# Patient Record
Sex: Male | Born: 1990 | Race: White | Hispanic: No | Marital: Single | Smoking: Never smoker
Health system: Southern US, Community
[De-identification: ages and names within clinical notes are randomized; demographics above are authoritative.]

---
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
## 2015-12-29 ENCOUNTER — Emergency Department
Admission: EM | Admit: 2015-12-29 | Discharge: 2015-12-29 | Disposition: A | Discharge status: Home / Self Care | Payer: BLUE CROSS/BLUE SHIELD | Attending: Emergency Medicine | Admitting: Emergency Medicine

## 2015-12-29 ENCOUNTER — Encounter: Payer: Self-pay | Admitting: *Deleted

## 2015-12-29 ENCOUNTER — Emergency Department: Payer: BLUE CROSS/BLUE SHIELD

## 2015-12-29 DIAGNOSIS — R55 Syncope and collapse: Secondary | ICD-10-CM | POA: Diagnosis not present

## 2015-12-29 DIAGNOSIS — R42 Dizziness and giddiness: Secondary | ICD-10-CM | POA: Diagnosis present

## 2015-12-29 LAB — COMPREHENSIVE METABOLIC PANEL
ALT: 13 U/L — ABNORMAL LOW (ref 17–63)
AST: 17 U/L (ref 15–41)
Albumin: 4.9 g/dL (ref 3.5–5.0)
Alkaline Phosphatase: 64 U/L (ref 38–126)
Anion gap: 10 (ref 5–15)
BUN: 13 mg/dL (ref 6–20)
CALCIUM: 9.8 mg/dL (ref 8.9–10.3)
CO2: 27 mmol/L (ref 22–32)
CREATININE: 0.86 mg/dL (ref 0.61–1.24)
Chloride: 102 mmol/L (ref 101–111)
Glucose, Bld: 100 mg/dL — ABNORMAL HIGH (ref 65–99)
Potassium: 4.6 mmol/L (ref 3.5–5.1)
SODIUM: 139 mmol/L (ref 135–145)
Total Bilirubin: 0.9 mg/dL (ref 0.3–1.2)
Total Protein: 7.5 g/dL (ref 6.5–8.1)

## 2015-12-29 LAB — CBC
HCT: 42.9 % (ref 40.0–52.0)
Hemoglobin: 14.8 g/dL (ref 13.0–18.0)
MCH: 32.3 pg (ref 26.0–34.0)
MCHC: 34.6 g/dL (ref 32.0–36.0)
MCV: 93.3 fL (ref 80.0–100.0)
PLATELETS: 234 10*3/uL (ref 150–440)
RBC: 4.6 MIL/uL (ref 4.40–5.90)
RDW: 12.1 % (ref 11.5–14.5)
WBC: 6 10*3/uL (ref 3.8–10.6)

## 2015-12-29 LAB — TROPONIN I

## 2015-12-29 MED ORDER — SODIUM CHLORIDE 0.9 % IV BOLUS (SEPSIS)
1000.0000 mL | Freq: Once | INTRAVENOUS | Status: AC
  Administered 2015-12-29: 1000 mL via INTRAVENOUS

## 2015-12-29 NOTE — ED Notes (Addendum)
Pt was at work today and had a dizzy spell.  Pt was seen at [facility] in [place] and pt was sent to er for eval of abnormal ekg.  Pt denies any pain.  No sob  No dizziness now.  Pt alert.  Speech clear. No n/v/d.

## 2015-12-29 NOTE — ED Provider Notes (Signed)
[facility] Emergency Department Provider Note  ____________________________________________  Time seen: Approximately 7:53 PM  I have reviewed the triage vital signs and the nursing notes.   HISTORY  Chief Complaint Dizziness    HPI Edward Duncan is a 25 y.o. male , otherwise healthy, presenting with lightheadedness. The patient reports that he was sitting at his desk when he developed a lightheaded sensation "I have never passed out but it felt like I might pass out." He had had a small breakfast and a light lunch, so he ate a protein bar and drank some water and slowly his symptoms resolved. He went to a local urgent care where they were concerned about his EKG so he was sent to the emergency department for further evaluation.  FH: No family history of connective tissue disorder, sudden cardiac death, sudden unknown or Death, or blood clots.  SH: Denies tobacco, cocaine or heroin abuse. Drinks one beer daily.   No past medical history on file.  There are no active problems to display for this patient.   No past surgical history on file.  No current outpatient prescriptions on file.  Allergies Review of patient's allergies indicates no known allergies.  No family history on file.  Social History Social History  Substance Use Topics  . Smoking status: Never Smoker   . Smokeless tobacco: Not on file  . Alcohol Use: No    Review of Systems Constitutional: No fever/chills.Positive lightheadedness. Negative syncope. Eyes: No visual changes. No blurred or double vision. ENT: No sore throat. No congestion or rhinorrhea. Cardiovascular: Denies chest pain. Denies palpitations. Respiratory: Denies shortness of breath.  No cough. Gastrointestinal: No abdominal pain.  No nausea, no vomiting.  No diarrhea.  No constipation. Genitourinary: Negative for dysuria. Musculoskeletal: Negative for back pain. Skin: Negative for rash. Neurological: Negative  for headaches. No focal numbness, tingling or weakness.   10-point ROS otherwise negative.  ____________________________________________   PHYSICAL EXAM:  VITAL SIGNS: ED Triage Vitals  Enc Vitals Group     BP 12/29/15 1746 141/83 mmHg     Pulse Rate 12/29/15 1746 68     Resp 12/29/15 1746 18     Temp 12/29/15 1746 98.5 F (36.9 C)     Temp Source 12/29/15 1746 Oral     SpO2 12/29/15 1746 98 %     Weight 12/29/15 1746 139 lb (63.05 kg)     Height 12/29/15 1746  (1.803 m)     Head Cir --      Peak Flow --      Pain Score --      Pain Loc --      Pain Edu? --      Excl. in GC? --     Constitutional: Alert and oriented. Well appearing and in no acute distress. Answers questions appropriately. Eyes: Conjunctivae are normal.  EOMI. No scleral icterus. Head: Atraumatic. Nose: No congestion/rhinnorhea. Mouth/Throat: Mucous membranes are moist.  Neck: No stridor.  Supple.  No JVD. No meningismus. Cardiovascular: Normal rate, regular rhythm. No murmurs, rubs or gallops.  Respiratory: Normal respiratory effort.  No accessory muscle use or retractions. Lungs CTAB.  No wheezes, rales or ronchi. Gastrointestinal: Soft, nontender and nondistended.  No guarding or rebound.  No peritoneal signs. Musculoskeletal: No LE edema. No ttp in the calves or palpable cords.  Negative Homan's sign. Neurologic:  A&Ox3.  Speech is clear.  Face and smile are symmetric.  EOMI.  Moves all extremities well. Normal gait without  ataxia. Skin:  Skin is warm, dry and intact. No rash noted. Psychiatric: Mood and affect are normal. Speech and behavior are normal.  Normal judgement.  ____________________________________________   LABS (all labs ordered are listed, but only abnormal results are displayed)  Labs Reviewed  COMPREHENSIVE METABOLIC PANEL - Abnormal; Notable for the following:    Glucose, Bld 100 (*)    ALT 13 (*)    All other components within normal limits  CBC  TROPONIN I    ____________________________________________  EKG  ED ECG REPORT I, Rockne MenghiniNorman, Anne-Caroline, the attending physician, personally viewed and interpreted this ECG.   Date: 12/29/2015  EKG Time: 1754  Rate: 69  Rhythm: normal sinus rhythm  Axis: normal  Intervals:none  ST&T Change: Nonspecific T-wave inversion in V1. No ST elevation. No evidence of Brugada syndrome or prolonged QTC. Patient does have some increased voltage criteria, but this is more likely due to being thin male then true hypertrophy.  This EKG is similar to an EKG obtained at urgent care.  ____________________________________________  RADIOLOGY  Dg Chest 2 View  12/29/2015  CLINICAL DATA:  Acute onset of dizziness and near syncope. Initial encounter. EXAM: CHEST  2 VIEW COMPARISON:  None. FINDINGS: The lungs are well-aerated. Mild peribronchial thickening is noted. There is no evidence of focal opacification, pleural effusion or pneumothorax. The heart is normal in size; the mediastinal contour is within normal limits. No acute osseous abnormalities are seen. IMPRESSION: Mild peribronchial thickening noted.  Lungs otherwise clear. Electronically Signed   By: Roanna RaiderJeffery  Chang M.D.   On: 12/29/2015 19:38    ____________________________________________   PROCEDURES  Procedure(s) performed: None  Critical Care performed: No ____________________________________________   INITIAL IMPRESSION / ASSESSMENT AND PLAN / ED COURSE  Pertinent labs & imaging results that were available during my care of the patient were reviewed by me and considered in my medical decision making (see chart for details).  25 y.o. male with a lightheaded spell which resolved with eating and drinking. The most likely etiology is that the patient was either mildly hypoglycemic or may be hypovolemic.  I do not see any evidence of arrhythmia, Brugada syndrome, hypertrophy or prolonged QTC. Given the patient's age, ACS or MI is very unlikely. I see  no evidence that would be concerning for aortic pathology or PE. At this time, the patient feels completely back to normal, and has no focal findings on exam. His vital signs are stable. I will plan to discharge the patient home and have him follow-up with a primary care physician. He understands return precautions as well as follow-up instructions.  ____________________________________________  FINAL CLINICAL IMPRESSION(S) / ED DIAGNOSES  Final diagnoses:  Lightheadedness  Pre-syncope      NEW MEDICATIONS STARTED DURING THIS VISIT:  New Prescriptions   No medications on file     Rockne MenghiniAnne-Caroline Meredith Kilbride, MD 12/29/15 1958

## 2015-12-29 NOTE — Discharge Instructions (Signed)
Please drink plenty of fluid to stay well-hydrated. Eat small regular meals throughout the day. Get plenty of rest.  Return to the emergency department if you develop severe pain, lightheadedness or fainting, shortness of breath, fever, or any other symptoms concerning to you.

## 2015-12-29 NOTE — ED Notes (Signed)
Patient transported to X-ray 

## 2016-01-04 ENCOUNTER — Ambulatory Visit (INDEPENDENT_AMBULATORY_CARE_PROVIDER_SITE_OTHER): Payer: BLUE CROSS/BLUE SHIELD | Admitting: Family Medicine

## 2016-01-04 ENCOUNTER — Encounter: Payer: Self-pay | Admitting: Family Medicine

## 2016-01-04 VITALS — BP 120/62 | HR 78 | Ht 71.0 in | Wt 134.0 lb

## 2016-01-04 DIAGNOSIS — R51 Headache: Secondary | ICD-10-CM

## 2016-01-04 DIAGNOSIS — R9431 Abnormal electrocardiogram [ECG] [EKG]: Secondary | ICD-10-CM | POA: Diagnosis not present

## 2016-01-04 DIAGNOSIS — R519 Headache, unspecified: Secondary | ICD-10-CM

## 2016-01-04 DIAGNOSIS — Z7189 Other specified counseling: Secondary | ICD-10-CM | POA: Diagnosis not present

## 2016-01-04 DIAGNOSIS — Z7689 Persons encountering health services in other specified circumstances: Secondary | ICD-10-CM

## 2016-01-04 MED ORDER — AMOXICILLIN 500 MG PO CAPS: 500.0000 mg | ORAL_CAPSULE | Freq: Three times a day (TID) | ORAL | Status: DC

## 2016-01-04 MED ORDER — MONTELUKAST SODIUM 10 MG PO TABS: 10.0000 mg | ORAL_TABLET | Freq: Every day | ORAL | Status: DC

## 2016-01-04 NOTE — Progress Notes (Signed)
Name: Edward Duncan   MRN: [id number]    DOB: January 01, 1991   Date:01/04/2016       Progress Note  Subjective  Chief Complaint  Chief Complaint  Patient presents with  . Establish Care  . Hypertension    follow up from ER- B/P was elevated- told to follow up with PCP    Hypertension This is a new problem. The current episode started 1 to 4 weeks ago. The problem has been gradually improving since onset. The problem is controlled. Pertinent negatives include no anxiety, blurred vision, chest pain, headaches, malaise/fatigue, neck pain, orthopnea, palpitations, peripheral edema, PND, shortness of breath or sweats. There are no associated agents to hypertension. Past treatments include nothing. The current treatment provides no improvement. There are no compliance problems.  There is no history of angina, kidney disease, CAD/MI, CVA, heart failure, PVD or retinopathy. ?HVH.    No problem-specific assessment & plan notes found for this encounter.   History reviewed. No pertinent past medical history.  Past Surgical History  Procedure Laterality Date  . Appendectomy  2004    Family History  Problem Relation Age of Onset  . Hypertension Mother   . Hypertension Father   . Cancer Maternal Grandmother   . Cancer Paternal Grandmother     Social History   Social History  . Marital Status: Single    Spouse Name: N/A  . Number of Children: N/A  . Years of Education: N/A   Occupational History  . Not on file.   Social History Main Topics  . Smoking status: Never Smoker   . Smokeless tobacco: Not on file  . Alcohol Use: 0.0 oz/week    0 Standard drinks or equivalent per week  . Drug Use: No  . Sexual Activity: No   Other Topics Concern  . Not on file   Social History Narrative    No Known Allergies   Review of Systems  Constitutional: Negative for fever, chills, weight loss and malaise/fatigue.  HENT: Negative for ear discharge, ear pain and sore throat.   Eyes: Negative  for blurred vision.  Respiratory: Negative for cough, sputum production, shortness of breath and wheezing.   Cardiovascular: Negative for chest pain, palpitations, orthopnea, leg swelling and PND.  Gastrointestinal: Negative for heartburn, nausea, abdominal pain, diarrhea, constipation, blood in stool and melena.  Genitourinary: Negative for dysuria, urgency, frequency and hematuria.  Musculoskeletal: Negative for myalgias, back pain, joint pain and neck pain.  Skin: Negative for rash.  Neurological: Negative for dizziness, tingling, sensory change, focal weakness and headaches.  Endo/Heme/Allergies: Negative for environmental allergies and polydipsia. Does not bruise/bleed easily.  Psychiatric/Behavioral: Negative for depression and suicidal ideas. The patient is not nervous/anxious and does not have insomnia.      Objective  Filed Vitals:   01/04/16 0848  BP: 120/62  Pulse: 78  Height:  (1.803 m)  Weight: 134 lb (60.782 kg)    Physical Exam  Constitutional: He is oriented to person, place, and time and well-developed, well-nourished, and in no distress.  HENT:  Head: Normocephalic.  Right Ear: External ear normal.  Left Ear: External ear normal.  Nose: Nose normal.  Mouth/Throat: Oropharynx is clear and moist.  Eyes: Conjunctivae and EOM are normal. Pupils are equal, round, and reactive to light. Right eye exhibits no discharge. Left eye exhibits no discharge. No scleral icterus.  Neck: Normal range of motion. Neck supple. No JVD present. No tracheal deviation present. No thyromegaly present.  Cardiovascular: Normal rate, regular  rhythm, S1 normal, S2 normal, normal heart sounds, intact distal pulses and normal pulses.  Exam reveals no gallop and no friction rub.   No murmur heard. PMI normal  Pulmonary/Chest: Breath sounds normal. No respiratory distress. He has no wheezes. He has no rales.  Abdominal: Soft. Bowel sounds are normal. He exhibits no mass. There is no  hepatosplenomegaly. There is no tenderness. There is no rebound, no guarding and no CVA tenderness.  Musculoskeletal: Normal range of motion. He exhibits no edema or tenderness.  Lymphadenopathy:    He has no cervical adenopathy.  Neurological: He is alert and oriented to person, place, and time. He has normal sensation, normal strength, normal reflexes and intact cranial nerves. No cranial nerve deficit.  Skin: Skin is warm. No rash noted.  Psychiatric: Mood and affect normal.  Nursing note and vitals reviewed.     Assessment & Plan  Problem List Items Addressed This Visit    None    Visit Diagnoses    Encounter to establish care with new doctor    -  Primary    Sinus headache        Relevant Medications    montelukast (SINGULAIR) 10 MG tablet    amoxicillin (AMOXIL) 500 MG capsule    Abnormal EKG        [facility]    Relevant Orders    Ambulatory referral to Cardiology    EKG 12-Lead (Completed)         Dr. Hayden Rasmusseneanna Peterka [facility]  01/04/2016

## 2016-09-12 ENCOUNTER — Ambulatory Visit (INDEPENDENT_AMBULATORY_CARE_PROVIDER_SITE_OTHER): Payer: BLUE CROSS/BLUE SHIELD | Admitting: Family Medicine

## 2016-09-12 ENCOUNTER — Encounter: Payer: Self-pay | Admitting: Family Medicine

## 2016-09-12 VITALS — BP 130/80 | HR 64 | Temp 98.0°F | Ht 71.0 in | Wt 137.0 lb

## 2016-09-12 DIAGNOSIS — J029 Acute pharyngitis, unspecified: Secondary | ICD-10-CM | POA: Diagnosis not present

## 2016-09-12 DIAGNOSIS — R51 Headache: Secondary | ICD-10-CM | POA: Diagnosis not present

## 2016-09-12 DIAGNOSIS — J301 Allergic rhinitis due to pollen: Secondary | ICD-10-CM | POA: Diagnosis not present

## 2016-09-12 DIAGNOSIS — R519 Headache, unspecified: Secondary | ICD-10-CM

## 2016-09-12 MED ORDER — MONTELUKAST SODIUM 10 MG PO TABS: 10.0000 mg | ORAL_TABLET | Freq: Every day | ORAL | 3 refills | Status: AC

## 2016-09-12 MED ORDER — FLUTICASONE PROPIONATE 50 MCG/ACT NA SUSP: 2.0000 | Freq: Every day | NASAL | 6 refills | Status: AC

## 2016-09-12 MED ORDER — AMOXICILLIN 500 MG PO CAPS: 500.0000 mg | ORAL_CAPSULE | Freq: Three times a day (TID) | ORAL | 0 refills | Status: DC

## 2016-09-12 NOTE — Patient Instructions (Signed)
Allergic Rhinitis Allergic rhinitis is when the mucous membranes in the nose respond to allergens. Allergens are particles in the air that cause your body to have an allergic reaction. This causes you to release allergic antibodies. Through a chain of events, these eventually cause you to release histamine into the blood stream. Although meant to protect the body, it is this release of histamine that causes your discomfort, such as frequent sneezing, congestion, and an itchy, runny nose. What are the causes? Seasonal allergic rhinitis (hay fever) is caused by pollen allergens that may come from grasses, trees, and weeds. Year-round allergic rhinitis (perennial allergic rhinitis) is caused by allergens such as house dust mites, pet dander, and mold spores. What are the signs or symptoms?  Nasal stuffiness (congestion).  Itchy, runny nose with sneezing and tearing of the eyes. How is this diagnosed? Your health care provider can help you determine the allergen or allergens that trigger your symptoms. If you and your health care provider are unable to determine the allergen, skin or blood testing may be used. Your health care provider will diagnose your condition after taking your health history and performing a physical exam. Your health care provider may assess you for other related conditions, such as asthma, pink eye, or an ear infection. How is this treated? Allergic rhinitis does not have a cure, but it can be controlled by:  Medicines that block allergy symptoms. These may include allergy shots, nasal sprays, and oral antihistamines.  Avoiding the allergen. Hay fever may often be treated with antihistamines in pill or nasal spray forms. Antihistamines block the effects of histamine. There are over-the-counter medicines that may help with nasal congestion and swelling around the eyes. Check with your health care provider before taking or giving this medicine. If avoiding the allergen or the  medicine prescribed do not work, there are many new medicines your health care provider can prescribe. Stronger medicine may be used if initial measures are ineffective. Desensitizing injections can be used if medicine and avoidance does not work. Desensitization is when a patient is given ongoing shots until the body becomes less sensitive to the allergen. Make sure you follow up with your health care provider if problems continue. Follow these instructions at home: It is not possible to completely avoid allergens, but you can reduce your symptoms by taking steps to limit your exposure to them. It helps to know exactly what you are allergic to so that you can avoid your specific triggers. Contact a health care provider if:  You have a fever.  You develop a cough that does not stop easily (persistent).  You have shortness of breath.  You start wheezing.  Symptoms interfere with normal daily activities. This information is not intended to replace advice given to you by your health care provider. Make sure you discuss any questions you have with your health care provider. Document Released: 03/28/2001 Document Revised: 03/03/2016 Document Reviewed: 03/10/2013 Elsevier Interactive Patient Education  2017 [employer] Inc[employer]  

## 2016-09-12 NOTE — Progress Notes (Signed)
Name: Edward Duncan   MRN: [id number]    DOB: Jan 07, 1991   Date:09/12/2016       Progress Note  Subjective  Chief Complaint  Chief Complaint  Patient presents with  . Sinusitis    sinus pressure, sore throat- no otc    Sinusitis  This is a new problem. The current episode started in the past 7 days. The problem has been gradually worsening since onset. There has been no fever. His pain is at a severity of 3/10. Associated symptoms include congestion, headaches, sinus pressure, sneezing, a sore throat and swollen glands. Pertinent negatives include no chills, coughing, diaphoresis, ear pain, hoarse voice, neck pain or shortness of breath. Treatments tried: advil. The treatment provided mild relief.  Sore Throat   This is a new problem. The current episode started in the past 7 days. The problem has been gradually worsening. The pain is worse on the left side. The pain is mild. Associated symptoms include congestion, headaches and swollen glands. Pertinent negatives include no abdominal pain, coughing, diarrhea, ear discharge, ear pain, hoarse voice, neck pain or shortness of breath. He has had no exposure to strep or mono. He has tried NSAIDs for the symptoms. The treatment provided mild relief.    No problem-specific Assessment & Plan notes found for this encounter.   No past medical history on file.  Past Surgical History:  Procedure Laterality Date  . APPENDECTOMY  2004    Family History  Problem Relation Age of Onset  . Hypertension Mother   . Hypertension Father   . Cancer Maternal Grandmother   . Cancer Paternal Grandmother     Social History   Social History  . Marital status: Single    Spouse name: N/A  . Number of children: N/A  . Years of education: N/A   Occupational History  . Not on file.   Social History Main Topics  . Smoking status: Never Smoker  . Smokeless tobacco: Never Used  . Alcohol use 0.0 oz/week  . Drug use: No  . Sexual activity: No   Other  Topics Concern  . Not on file   Social History Narrative  . No narrative on file    No Known Allergies  Outpatient Medications Prior to Visit  Medication Sig Dispense Refill  . amoxicillin (AMOXIL) 500 MG capsule Take 1 capsule (500 mg total) by mouth 3 (three) times daily. 30 capsule 0  . montelukast (SINGULAIR) 10 MG tablet Take 1 tablet (10 mg total) by mouth at bedtime. (Patient not taking: Reported on 09/12/2016) 30 tablet 3   No facility-administered medications prior to visit.     Review of Systems  Constitutional: Negative for chills, diaphoresis, fever, malaise/fatigue and weight loss.  HENT: Positive for congestion, sinus pressure, sneezing and sore throat. Negative for ear discharge, ear pain and hoarse voice.   Eyes: Negative for blurred vision.  Respiratory: Negative for cough, sputum production, shortness of breath and wheezing.   Cardiovascular: Negative for chest pain, palpitations and leg swelling.  Gastrointestinal: Negative for abdominal pain, blood in stool, constipation, diarrhea, heartburn, melena and nausea.  Genitourinary: Negative for dysuria, frequency, hematuria and urgency.  Musculoskeletal: Negative for back pain, joint pain, myalgias and neck pain.  Skin: Negative for rash.  Neurological: Positive for headaches. Negative for dizziness, tingling, sensory change and focal weakness.  Endo/Heme/Allergies: Negative for environmental allergies and polydipsia. Does not bruise/bleed easily.  Psychiatric/Behavioral: Negative for depression and suicidal ideas. The patient is not nervous/anxious and does not  have insomnia.      Objective  Vitals:   09/12/16 0816  BP: 130/80  Pulse: 64  Temp: 98 F (36.7 C)  TempSrc: Oral  Weight: 137 lb (62.1 kg)  Height: 5\' 11"  (1.803 m)    Physical Exam  Constitutional: He is oriented to person, place, and time and well-developed, well-nourished, and in no distress.  HENT:  Head: Normocephalic.  Right Ear:  Tympanic membrane, external ear and ear canal normal.  Left Ear: Tympanic membrane, external ear and ear canal normal.  Nose: Mucosal edema present. Right sinus exhibits no maxillary sinus tenderness and no frontal sinus tenderness. Left sinus exhibits no maxillary sinus tenderness and no frontal sinus tenderness.  Mouth/Throat: Oropharyngeal exudate, posterior oropharyngeal edema and posterior oropharyngeal erythema present. No tonsillar abscesses.  Eyes: Conjunctivae and EOM are normal. Pupils are equal, round, and reactive to light. Right eye exhibits no discharge. Left eye exhibits no discharge. No scleral icterus.  Neck: Normal range of motion. Neck supple. No JVD present. No tracheal deviation present. No thyromegaly present.  Cardiovascular: Normal rate, regular rhythm, normal heart sounds and intact distal pulses.  Exam reveals no gallop and no friction rub.   No murmur heard. Pulmonary/Chest: Breath sounds normal. No respiratory distress. He has no wheezes. He has no rales.  Abdominal: Soft. Bowel sounds are normal. He exhibits no mass. There is no hepatosplenomegaly. There is no tenderness. There is no rebound, no guarding and no CVA tenderness.  Musculoskeletal: Normal range of motion. He exhibits no edema or tenderness.  Lymphadenopathy:       Head (right side): Submental, submandibular and tonsillar adenopathy present.       Head (left side): Submental, submandibular and tonsillar adenopathy present.    He has no cervical adenopathy.       Right cervical: No superficial cervical adenopathy present.      Left cervical: No superficial cervical adenopathy present.  tender  Neurological: He is alert and oriented to person, place, and time. He has normal sensation, normal strength, normal reflexes and intact cranial nerves. No cranial nerve deficit.  Skin: Skin is warm. No rash noted.  Psychiatric: Mood and affect normal.  Nursing note and vitals reviewed.     Assessment &  Plan  Problem List Items Addressed This Visit    None    Visit Diagnoses    Pharyngitis, unspecified etiology    -  Primary   Relevant Medications   amoxicillin (AMOXIL) 500 MG capsule   Sinus headache       Relevant Medications   montelukast (SINGULAIR) 10 MG tablet   amoxicillin (AMOXIL) 500 MG capsule   fluticasone (FLONASE) 50 MCG/ACT nasal spray   Acute seasonal allergic rhinitis due to pollen       Relevant Medications   fluticasone (FLONASE) 50 MCG/ACT nasal spray      Meds ordered this encounter  Medications  . montelukast (SINGULAIR) 10 MG tablet    Sig: Take 1 tablet (10 mg total) by mouth at bedtime.    Dispense:  30 tablet    Refill:  3  . amoxicillin (AMOXIL) 500 MG capsule    Sig: Take 1 capsule (500 mg total) by mouth 3 (three) times daily.    Dispense:  30 capsule    Refill:  0  . fluticasone (FLONASE) 50 MCG/ACT nasal spray    Sig: Place 2 sprays into both nostrils daily.    Dispense:  16 g    Refill:  6  Dr. Hayden Rasmusseneanna Pellow [facility]  09/12/16

## 2016-09-27 ENCOUNTER — Ambulatory Visit (INDEPENDENT_AMBULATORY_CARE_PROVIDER_SITE_OTHER): Payer: BLUE CROSS/BLUE SHIELD | Admitting: Family Medicine

## 2016-09-27 ENCOUNTER — Encounter: Payer: Self-pay | Admitting: Family Medicine

## 2016-09-27 VITALS — BP 120/88 | HR 64 | Ht 71.0 in | Wt 138.0 lb

## 2016-09-27 DIAGNOSIS — J029 Acute pharyngitis, unspecified: Secondary | ICD-10-CM

## 2016-09-27 NOTE — Progress Notes (Signed)
Name: Edward Duncan   MRN: [id number]    DOB: [birth year]   Date:09/27/2016       Progress Note  Subjective  Chief Complaint  Chief Complaint  Patient presents with  . Follow-up    finished Amox approx 5 days ago- feeling better- still taking OTC loratadine and Rx allergy med    Sore Throat   This is a new problem. The current episode started in the past 7 days. The problem has been gradually improving. The pain is worse on the left side. There has been no fever. The patient is experiencing no pain. Associated symptoms include congestion, neck pain and swollen glands. Pertinent negatives include no abdominal pain, coughing, diarrhea, drooling, ear discharge, ear pain, headaches, hoarse voice, plugged ear sensation, shortness of breath, stridor, trouble swallowing or vomiting. He has had no exposure to strep or mono. Treatments tried: amoxil. The treatment provided moderate relief.    No problem-specific Assessment & Plan notes found for this encounter.   No past medical history on file.  Past Surgical History:  Procedure Laterality Date  . APPENDECTOMY  2004    Family History  Problem Relation Age of Onset  . Hypertension Mother   . Hypertension Father   . Cancer Maternal Grandmother   . Cancer Paternal Grandmother     Social History   Social History  . Marital status: Single    Spouse name: N/A  . Number of children: N/A  . Years of education: N/A   Occupational History  . Not on file.   Social History Main Topics  . Smoking status: Never Smoker  . Smokeless tobacco: Never Used  . Alcohol use 0.0 oz/week  . Drug use: No  . Sexual activity: No   Other Topics Concern  . Not on file   Social History Narrative  . No narrative on file    No Known Allergies  Outpatient Medications Prior to Visit  Medication Sig Dispense Refill  . fluticasone (FLONASE) 50 MCG/ACT nasal spray Place 2 sprays into both nostrils daily. 16 g 6  . montelukast (SINGULAIR) 10 MG tablet  Take 1 tablet (10 mg total) by mouth at bedtime. 30 tablet 3  . amoxicillin (AMOXIL) 500 MG capsule Take 1 capsule (500 mg total) by mouth 3 (three) times daily. 30 capsule 0   No facility-administered medications prior to visit.     Review of Systems  Constitutional: Negative for chills, fever, malaise/fatigue and weight loss.  HENT: Positive for congestion. Negative for drooling, ear discharge, ear pain, hoarse voice, sore throat and trouble swallowing.   Eyes: Negative for blurred vision.  Respiratory: Negative for cough, sputum production, shortness of breath, wheezing and stridor.   Cardiovascular: Negative for chest pain, palpitations and leg swelling.  Gastrointestinal: Negative for abdominal pain, blood in stool, constipation, diarrhea, heartburn, melena, nausea and vomiting.  Genitourinary: Negative for dysuria, frequency, hematuria and urgency.  Musculoskeletal: Positive for neck pain. Negative for back pain, joint pain and myalgias.  Skin: Negative for rash.  Neurological: Negative for dizziness, tingling, sensory change, focal weakness and headaches.  Endo/Heme/Allergies: Negative for environmental allergies and polydipsia. Does not bruise/bleed easily.  Psychiatric/Behavioral: Negative for depression and suicidal ideas. The patient is not nervous/anxious and does not have insomnia.      Objective  Vitals:   09/27/16 0808  BP: 120/88  Pulse: 64  Weight: 138 lb (62.6 kg)  Height: 5\' 11"  (1.803 m)    Physical Exam  Constitutional: He is oriented to person,  place, and time and well-developed, well-nourished, and in no distress.  HENT:  Head: Normocephalic.  Right Ear: Tympanic membrane, external ear and ear canal normal.  Left Ear: Tympanic membrane, external ear and ear canal normal.  Nose: Nose normal.  Mouth/Throat: Oropharynx is clear and moist. No oropharyngeal exudate, posterior oropharyngeal edema or posterior oropharyngeal erythema.  Eyes: Conjunctivae and EOM  are normal. Pupils are equal, round, and reactive to light. Right eye exhibits no discharge. Left eye exhibits no discharge. No scleral icterus.  Neck: Normal range of motion. Neck Duncan. No JVD present. No tracheal deviation present. No thyromegaly present.  Cardiovascular: Normal rate, regular rhythm, normal heart sounds and intact distal pulses.  Exam reveals no gallop and no friction rub.   No murmur heard. Pulmonary/Chest: Breath sounds normal. No respiratory distress. He has no wheezes. He has no rales.  Abdominal: Soft. Bowel sounds are normal. He exhibits no mass. There is no hepatosplenomegaly. There is no tenderness. There is no rebound, no guarding and no CVA tenderness.  Musculoskeletal: Normal range of motion. He exhibits no edema or tenderness.  Lymphadenopathy:       Head (right side): Preauricular adenopathy present. No submental, no submandibular, no tonsillar and no occipital adenopathy present.       Head (left side): No submental, no submandibular, no tonsillar and no occipital adenopathy present.    He has no cervical adenopathy.       Right cervical: No superficial cervical and no posterior cervical adenopathy present.      Left cervical: No superficial cervical and no posterior cervical adenopathy present.    He has no axillary adenopathy.       Right: No inguinal, no supraclavicular and no epitrochlear adenopathy present.       Left: No inguinal, no supraclavicular and no epitrochlear adenopathy present.  Neurological: He is alert and oriented to person, place, and time. He has normal sensation, normal strength, normal reflexes and intact cranial nerves. No cranial nerve deficit.  Skin: Skin is warm. No rash noted.  Psychiatric: Mood and affect normal.  Nursing note and vitals reviewed.     Assessment & Plan  Problem List Items Addressed This Visit    None    Visit Diagnoses    Allergic pharyngitis    -  Primary   no lymphadenopathy/cont flonase      No  orders of the defined types were placed in this encounter.     Dr. Hayden Rasmusseneanna [facility]  09/27/16

## 2016-11-07 ENCOUNTER — Other Ambulatory Visit: Payer: Self-pay

## 2016-11-08 ENCOUNTER — Ambulatory Visit
Admission: RE | Admit: 2016-11-08 | Discharge: 2016-11-08 | Disposition: A | Discharge status: Home / Self Care | Payer: BLUE CROSS/BLUE SHIELD | Source: Ambulatory Visit | Attending: Family Medicine | Admitting: Family Medicine

## 2016-11-08 ENCOUNTER — Encounter: Payer: Self-pay | Admitting: Family Medicine

## 2016-11-08 ENCOUNTER — Ambulatory Visit (INDEPENDENT_AMBULATORY_CARE_PROVIDER_SITE_OTHER): Payer: BLUE CROSS/BLUE SHIELD | Admitting: Family Medicine

## 2016-11-08 VITALS — BP 120/70 | HR 72 | Ht 71.0 in | Wt 138.0 lb

## 2016-11-08 DIAGNOSIS — X58XXXA Exposure to other specified factors, initial encounter: Secondary | ICD-10-CM | POA: Diagnosis not present

## 2016-11-08 DIAGNOSIS — S9032XA Contusion of left foot, initial encounter: Secondary | ICD-10-CM

## 2016-11-08 MED ORDER — MELOXICAM 15 MG PO TABS: 15.0000 mg | ORAL_TABLET | Freq: Every day | ORAL | 0 refills | Status: DC

## 2016-11-08 NOTE — Progress Notes (Signed)
Name: Edward Duncan   MRN: [id number]    DOB: 1991/06/03   Date:11/08/2016       Progress Note  Subjective  Chief Complaint  Chief Complaint  Patient presents with  . Foot Pain    jumped over an object approx 2 weeks ago and landed on L) foot. Felt a pain at heel of foot. Is a  sharp pain on the L) side of L) heel when walking    Foot Pain  This is a new (jumping a fence ,landed on concrete, feltimmediate of left calcaneus) problem. The current episode started 1 to 4 weeks ago (10 days). The problem occurs daily. The problem has been waxing and waning. Pertinent negatives include no abdominal pain, arthralgias, chest pain, chills, coughing, fatigue, fever, headaches, myalgias, nausea, neck pain, numbness, rash, sore throat or weakness. The symptoms are aggravated by walking and standing. He has tried NSAIDs and ice for the symptoms. The treatment provided mild (not resolution) relief.    No problem-specific Assessment & Plan notes found for this encounter.   No past medical history on file.  Past Surgical History:  Procedure Laterality Date  . APPENDECTOMY  2004    Family History  Problem Relation Age of Onset  . Hypertension Mother   . Hypertension Father   . Cancer Maternal Grandmother   . Cancer Paternal Grandmother     Social History   Social History  . Marital status: Single    Spouse name: N/A  . Number of children: N/A  . Years of education: N/A   Occupational History  . Not on file.   Social History Main Topics  . Smoking status: Never Smoker  . Smokeless tobacco: Never Used  . Alcohol use 0.0 oz/week  . Drug use: No  . Sexual activity: No   Other Topics Concern  . Not on file   Social History Narrative  . No narrative on file    No Known Allergies  Outpatient Medications Prior to Visit  Medication Sig Dispense Refill  . fluticasone (FLONASE) 50 MCG/ACT nasal spray Place 2 sprays into both nostrils daily. 16 g 6  . loratadine (CLARITIN) 10 MG  tablet Take 10 mg by mouth daily. otc    . montelukast (SINGULAIR) 10 MG tablet Take 1 tablet (10 mg total) by mouth at bedtime. 30 tablet 3   No facility-administered medications prior to visit.     Review of Systems  Constitutional: Negative for chills, fatigue, fever, malaise/fatigue and weight loss.  HENT: Negative for ear discharge, ear pain and sore throat.   Eyes: Negative for blurred vision.  Respiratory: Negative for cough, sputum production, shortness of breath and wheezing.   Cardiovascular: Negative for chest pain, palpitations and leg swelling.  Gastrointestinal: Negative for abdominal pain, blood in stool, constipation, diarrhea, heartburn, melena and nausea.  Genitourinary: Negative for dysuria, frequency, hematuria and urgency.  Musculoskeletal: Negative for arthralgias, back pain, joint pain, myalgias and neck pain.  Skin: Negative for rash.  Neurological: Negative for dizziness, tingling, sensory change, focal weakness, weakness, numbness and headaches.  Endo/Heme/Allergies: Negative for environmental allergies and polydipsia. Does not bruise/bleed easily.  Psychiatric/Behavioral: Negative for depression and suicidal ideas. The patient is not nervous/anxious and does not have insomnia.      Objective  Vitals:   11/08/16 0813  BP: 120/70  Pulse: 72  Weight: 138 lb (62.6 kg)  Height:  (1.803 m)    Physical Exam  Constitutional: He is oriented to person, place, and time  and well-developed, well-nourished, and in no distress.  HENT:  Head: Normocephalic.  Right Ear: External ear normal.  Left Ear: External ear normal.  Nose: Nose normal.  Mouth/Throat: Oropharynx is clear and moist.  Eyes: Conjunctivae and EOM are normal. Pupils are equal, round, and reactive to light. Right eye exhibits no discharge. Left eye exhibits no discharge. No scleral icterus.  Neck: Normal range of motion. Neck supple. No JVD present. No tracheal deviation present. No thyromegaly  present.  Cardiovascular: Normal rate, regular rhythm, normal heart sounds and intact distal pulses.  Exam reveals no gallop and no friction rub.   No murmur heard. Pulmonary/Chest: Breath sounds normal. No respiratory distress. He has no wheezes. He has no rales.  Abdominal: Soft. Bowel sounds are normal. He exhibits no mass. There is no hepatosplenomegaly. There is no tenderness. There is no rebound, no guarding and no CVA tenderness.  Musculoskeletal: Normal range of motion. He exhibits no edema or tenderness.       Feet:  tennder medial/lateral aspect calcaneus/along insertion of achilles  Lymphadenopathy:    He has no cervical adenopathy.  Neurological: He is alert and oriented to person, place, and time. He has normal sensation, normal strength and intact cranial nerves.  Skin: Skin is warm. No rash noted.  Psychiatric: Mood and affect normal.  Nursing note and vitals reviewed.     Assessment & Plan  Problem List Items Addressed This Visit    None    Visit Diagnoses    Contusion of foot or heel, left, initial encounter    -  Primary   Relevant Medications   meloxicam (MOBIC) 15 MG tablet   Other Relevant Orders   DG Os Calcis Left (Completed)      Meds ordered this encounter  Medications  . meloxicam (MOBIC) 15 MG tablet    Sig: Take 1 tablet (15 mg total) by mouth daily.    Dispense:  30 tablet    Refill:  0      Dr. Hayden [facility]  11/08/16

## 2017-08-03 ENCOUNTER — Ambulatory Visit (INDEPENDENT_AMBULATORY_CARE_PROVIDER_SITE_OTHER): Payer: BLUE CROSS/BLUE SHIELD | Admitting: Family Medicine

## 2017-08-03 ENCOUNTER — Encounter: Payer: Self-pay | Admitting: Family Medicine

## 2017-08-03 VITALS — BP 120/78 | HR 100 | Temp 99.3°F | Ht 72.0 in | Wt 135.0 lb

## 2017-08-03 DIAGNOSIS — J020 Streptococcal pharyngitis: Secondary | ICD-10-CM

## 2017-08-03 LAB — POCT RAPID STREP A (OFFICE): Rapid Strep A Screen: POSITIVE — AB

## 2017-08-03 MED ORDER — PENICILLIN V POTASSIUM 500 MG PO TABS: 500.0000 mg | ORAL_TABLET | Freq: Four times a day (QID) | ORAL | 0 refills | Status: AC

## 2017-08-03 NOTE — Progress Notes (Signed)
Name: Edward Duncan   MRN: [id number]    DOB: [birth year]   Date:08/03/2017       Progress Note  Subjective  Chief Complaint  Chief Complaint  Patient presents with  . Sore Throat    fever, swollen glands    Sore Throat   This is a new problem. The current episode started in the past 7 days (uri 2 wks ago/sore throat tuesday). The problem has been gradually worsening. The pain is worse on the left side. The maximum temperature recorded prior to his arrival was 100.4 - 100.9 F. The fever has been present for 1 to 2 days. The pain is at a severity of 4/10. The pain is moderate. Associated symptoms include congestion, coughing, ear pain and a hoarse voice. Pertinent negatives include no abdominal pain, diarrhea, drooling, ear discharge, headaches, plugged ear sensation, neck pain, shortness of breath, stridor, swollen glands, trouble swallowing or vomiting. He has had no exposure to strep or mono. He has tried nothing for the symptoms.    No problem-specific Assessment & Plan notes found for this encounter.   History reviewed. No pertinent past medical history.  Past Surgical History:  Procedure Laterality Date  . APPENDECTOMY  2004    Family History  Problem Relation Age of Onset  . Hypertension Mother   . Hypertension Father   . Cancer Maternal Grandmother   . Cancer Paternal Grandmother     Social History   Socioeconomic History  . Marital status: Single    Spouse name: Not on file  . Number of children: Not on file  . Years of education: Not on file  . Highest education level: Not on file  Social Needs  . Financial resource strain: Not on file  . Food insecurity - worry: Not on file  . Food insecurity - inability: Not on file  . Transportation needs - medical: Not on file  . Transportation needs - non-medical: Not on file  Occupational History  . Not on file  Tobacco Use  . Smoking status: Never Smoker  . Smokeless tobacco: Never Used  Substance and Sexual Activity   . Alcohol use: Yes    Alcohol/week: 0.0 oz  . Drug use: No  . Sexual activity: No  Other Topics Concern  . Not on file  Social History Narrative  . Not on file    No Known Allergies  Outpatient Medications Prior to Visit  Medication Sig Dispense Refill  . fluticasone (FLONASE) 50 MCG/ACT nasal spray Place 2 sprays into both nostrils daily. (Patient not taking: Reported on 08/03/2017) 16 g 6  . loratadine (CLARITIN) 10 MG tablet Take 10 mg by mouth daily. otc    . montelukast (SINGULAIR) 10 MG tablet Take 1 tablet (10 mg total) by mouth at bedtime. (Patient not taking: Reported on 08/03/2017) 30 tablet 3  . meloxicam (MOBIC) 15 MG tablet Take 1 tablet (15 mg total) by mouth daily. 30 tablet 0   No facility-administered medications prior to visit.     Review of Systems  Constitutional: Negative for chills, fever, malaise/fatigue and weight loss.  HENT: Positive for congestion, ear pain and hoarse voice. Negative for drooling, ear discharge, sore throat and trouble swallowing.   Eyes: Negative for blurred vision.  Respiratory: Positive for cough. Negative for sputum production, shortness of breath, wheezing and stridor.   Cardiovascular: Negative for chest pain, palpitations and leg swelling.  Gastrointestinal: Negative for abdominal pain, blood in stool, constipation, diarrhea, heartburn, melena, nausea and vomiting.  Genitourinary: Negative for dysuria, frequency, hematuria and urgency.  Musculoskeletal: Negative for back pain, joint pain, myalgias and neck pain.  Skin: Negative for rash.  Neurological: Negative for dizziness, tingling, sensory change, focal weakness and headaches.  Endo/Heme/Allergies: Negative for environmental allergies and polydipsia. Does not bruise/bleed easily.  Psychiatric/Behavioral: Negative for depression and suicidal ideas. The patient is not nervous/anxious and does not have insomnia.      Objective  Vitals:   08/03/17 0923  BP: 120/78  Pulse:  100  Temp: 99.3 F (37.4 C)  TempSrc: Oral  Weight: 135 lb (61.2 kg)  Height: 6' (1.829 m)    Physical Exam  Constitutional: He is oriented to person, place, and time and well-developed, well-nourished, and in no distress.  HENT:  Head: Normocephalic.  Right Ear: Tympanic membrane, external ear and ear canal normal.  Left Ear: Tympanic membrane, external ear and ear canal normal.  Nose: Mucosal edema present. Right sinus exhibits no maxillary sinus tenderness and no frontal sinus tenderness. Left sinus exhibits no maxillary sinus tenderness and no frontal sinus tenderness.  Mouth/Throat: Uvula is midline and mucous membranes are normal. Posterior oropharyngeal erythema present. No oropharyngeal exudate, posterior oropharyngeal edema or tonsillar abscesses.  Eyes: Conjunctivae and EOM are normal. Pupils are equal, round, and reactive to light. Right eye exhibits no discharge. Left eye exhibits no discharge. No scleral icterus.  Neck: Normal range of motion. Neck supple. No JVD present. No tracheal deviation present. No thyromegaly present.  Cardiovascular: Normal rate, regular rhythm, normal heart sounds and intact distal pulses. Exam reveals no gallop and no friction rub.  No murmur heard. Pulmonary/Chest: Breath sounds normal. No respiratory distress. He has no wheezes. He has no rales.  Abdominal: Soft. Bowel sounds are normal. He exhibits no mass. There is no hepatosplenomegaly. There is no tenderness. There is no rebound, no guarding and no CVA tenderness.  Musculoskeletal: Normal range of motion. He exhibits no edema or tenderness.  Lymphadenopathy:       Head (right side): Submandibular adenopathy present.       Head (left side): Submandibular adenopathy present.    He has no cervical adenopathy.  tender  Neurological: He is alert and oriented to person, place, and time. He has normal sensation, normal strength, normal reflexes and intact cranial nerves. No cranial nerve deficit.   Skin: Skin is warm. No rash noted.  Psychiatric: Mood and affect normal.  Nursing note and vitals reviewed.     Assessment & Plan  Problem List Items Addressed This Visit    None    Visit Diagnoses    Pharyngitis due to Streptococcus species    -  Primary   Relevant Medications   penicillin v potassium (VEETID) 500 MG tablet   Other Relevant Orders   POCT rapid strep A (Completed)      Meds ordered this encounter  Medications  . penicillin v potassium (VEETID) 500 MG tablet    Sig: Take 1 tablet (500 mg total) by mouth 4 (four) times daily.    Dispense:  40 tablet    Refill:  0      Dr. Elizabeth Sauer [facility]  08/03/17

## 2017-08-07 ENCOUNTER — Ambulatory Visit (INDEPENDENT_AMBULATORY_CARE_PROVIDER_SITE_OTHER): Payer: BLUE CROSS/BLUE SHIELD | Admitting: Family Medicine

## 2017-08-07 ENCOUNTER — Other Ambulatory Visit: Payer: Self-pay

## 2017-08-07 ENCOUNTER — Encounter: Payer: Self-pay | Admitting: Family Medicine

## 2017-08-07 VITALS — BP 120/84 | HR 88 | Ht 72.0 in | Wt 132.0 lb

## 2017-08-07 DIAGNOSIS — K120 Recurrent oral aphthae: Secondary | ICD-10-CM | POA: Diagnosis not present

## 2017-08-07 MED ORDER — VALACYCLOVIR HCL 500 MG PO TABS: 500.0000 mg | ORAL_TABLET | Freq: Two times a day (BID) | ORAL | 0 refills | Status: AC

## 2017-08-07 NOTE — Progress Notes (Signed)
Name: Edward Duncan   MRN: [id number]    DOB: 01-05-1991   Date:08/07/2017       Progress Note  Subjective  Chief Complaint  Chief Complaint  Patient presents with  . tongue sores    was Dx with strep- has been on pen x 4 days- sore on tongue now that hurt    Mouth Lesions   The current episode started 3 to 5 days ago. The onset was gradual. The problem has been gradually worsening. The problem is moderate. Associated symptoms include mouth sores, swollen glands and rash. Pertinent negatives include no fever, no eye itching, no photophobia, no abdominal pain, no constipation, no diarrhea, no nausea, no congestion, no ear discharge, no ear pain, no headaches, no sore throat, no stridor, no neck pain, no cough and no wheezing.    No problem-specific Assessment & Plan notes found for this encounter.   No past medical history on file.  Past Surgical History:  Procedure Laterality Date  . APPENDECTOMY  2004    Family History  Problem Relation Age of Onset  . Hypertension Mother   . Hypertension Father   . Cancer Maternal Grandmother   . Cancer Paternal Grandmother     Social History   Socioeconomic History  . Marital status: Single    Spouse name: Not on file  . Number of children: Not on file  . Years of education: Not on file  . Highest education level: Not on file  Social Needs  . Financial resource strain: Not on file  . Food insecurity - worry: Not on file  . Food insecurity - inability: Not on file  . Transportation needs - medical: Not on file  . Transportation needs - non-medical: Not on file  Occupational History  . Not on file  Tobacco Use  . Smoking status: Never Smoker  . Smokeless tobacco: Never Used  Substance and Sexual Activity  . Alcohol use: Yes    Alcohol/week: 0.0 oz  . Drug use: No  . Sexual activity: No  Other Topics Concern  . Not on file  Social History Narrative  . Not on file    No Known Allergies  Outpatient Medications Prior to  Visit  Medication Sig Dispense Refill  . penicillin v potassium (VEETID) 500 MG tablet Take 1 tablet (500 mg total) by mouth 4 (four) times daily. 40 tablet 0  . fluticasone (FLONASE) 50 MCG/ACT nasal spray Place 2 sprays into both nostrils daily. (Patient not taking: Reported on 08/03/2017) 16 g 6  . loratadine (CLARITIN) 10 MG tablet Take 10 mg by mouth daily. otc    . montelukast (SINGULAIR) 10 MG tablet Take 1 tablet (10 mg total) by mouth at bedtime. (Patient not taking: Reported on 08/03/2017) 30 tablet 3   No facility-administered medications prior to visit.     Review of Systems  Constitutional: Negative for chills, fever, malaise/fatigue and weight loss.  HENT: Positive for mouth sores. Negative for congestion, ear discharge, ear pain and sore throat.   Eyes: Negative for blurred vision, photophobia and itching.  Respiratory: Negative for cough, sputum production, shortness of breath, wheezing and stridor.   Cardiovascular: Negative for chest pain, palpitations and leg swelling.  Gastrointestinal: Negative for abdominal pain, blood in stool, constipation, diarrhea, heartburn, melena and nausea.  Genitourinary: Negative for dysuria, frequency, hematuria and urgency.  Musculoskeletal: Negative for back pain, joint pain, myalgias and neck pain.  Skin: Positive for rash.  Neurological: Negative for dizziness, tingling, sensory change, focal  weakness and headaches.  Endo/Heme/Allergies: Negative for environmental allergies and polydipsia. Does not bruise/bleed easily.  Psychiatric/Behavioral: Negative for depression and suicidal ideas. The patient is not nervous/anxious and does not have insomnia.      Objective  Vitals:   08/07/17 1519  BP: 120/84  Pulse: 88  Weight: 132 lb (59.9 kg)  Height: 6' (1.829 m)    Physical Exam  Constitutional: He is oriented to person, place, and time and well-developed, well-nourished, and in no distress.  HENT:  Head: Normocephalic.  Right  Ear: External ear normal.  Left Ear: External ear normal.  Nose: Nose normal.  Mouth/Throat: Uvula is midline and oropharynx is clear and moist. No oropharyngeal exudate, posterior oropharyngeal edema or posterior oropharyngeal erythema.  Small ulcers along edge of tongue  Eyes: Conjunctivae and EOM are normal. Pupils are equal, round, and reactive to light. Right eye exhibits no discharge. Left eye exhibits no discharge. No scleral icterus.  Neck: Normal range of motion. Neck supple. No JVD present. No tracheal deviation present. No thyromegaly present.  Cardiovascular: Normal rate, regular rhythm, normal heart sounds and intact distal pulses. Exam reveals no gallop and no friction rub.  No murmur heard. Pulmonary/Chest: Breath sounds normal. No respiratory distress. He has no wheezes. He has no rales.  Abdominal: Soft. Bowel sounds are normal. He exhibits no mass. There is no hepatosplenomegaly. There is no tenderness. There is no rebound, no guarding and no CVA tenderness.  Musculoskeletal: Normal range of motion. He exhibits no edema or tenderness.  Lymphadenopathy:       Head (right side): Submandibular adenopathy present.       Head (left side): Submandibular adenopathy present.    He has no cervical adenopathy.  tender  Neurological: He is alert and oriented to person, place, and time. He has normal sensation, normal strength, normal reflexes and intact cranial nerves. No cranial nerve deficit.  Skin: Skin is warm. No rash noted.  Psychiatric: Mood and affect normal.  Nursing note and vitals reviewed.     Assessment & Plan  Problem List Items Addressed This Visit    None    Visit Diagnoses    Aphthous stomatitis    -  Primary   add dukes magic mw   Relevant Medications   valACYclovir (VALTREX) 500 MG tablet      Meds ordered this encounter  Medications  . valACYclovir (VALTREX) 500 MG tablet    Sig: Take 1 tablet (500 mg total) by mouth 2 (two) times daily.     Dispense:  20 tablet    Refill:  0      Dr. Elizabeth Sauer [facility]  08/07/17

## 2017-08-07 NOTE — Patient Instructions (Addendum)
Stomatitis Stomatitis is a condition that causes inflammation in your mouth. It can affect a part of your mouth or your whole mouth. The condition often affects your cheek, teeth, gums, lips, and tongue. Stomatitis can also affect the mucous membranes that surround your mouth (mucosa). Pain from stomatitis can make it hard for you to eat or drink. Severe cases of this condition can lead to dehydration or poor nutrition. What are the causes? Common causes of this condition include:  Viruses, such as cold sores or oral herpes and shingles.  Canker sores.  Bacterial infections.  Fungus or yeast infections, such as oral thrush.  Not getting adequate nutrition.  Injury to your mouth. This can be from: ? Dentures or braces that do not fit well. ? Biting your tongue or cheek. ? Burning your mouth. ? Having sharp or broken teeth.  Gum disease.  Using tobacco, especially chewing tobacco.  Allergies to foods, medicines, or substances that are used in your mouth.  Medicines, including cancer medicines (chemotherapy), antihistamines, and seizure medicines.  In some cases, the cause may not be known. What increases the risk? This condition is more likely to develop in people who:  Have poor oral hygiene or poor nutrition.  Have any condition that causes a dry mouth.  Are under a lot of physical or emotional stress.  Have any condition that weakens the body's defense system (immune system).  Are being treated for cancer.  Smoke.  What are the signs or symptoms? The most common symptoms of this condition are pain, swelling, and redness inside your mouth. The pain may feel like burning or stinging. It may get worse from eating or drinking. Other symptoms include:  Painful, shallow sores (ulcers) in the mouth.  Blisters in the mouth.  Bleeding gums.  Swollen gums.  Irritability and fatigue.  Bad breath.  Bad taste in the mouth.  Fever.  How is this diagnosed? This  condition is diagnosed with a physical exam to check for bleeding gums and mouth ulcers. You may also have other tests, including:  Blood tests to look for infection or vitamin deficiencies.  Mouth swab to get a fluid sample to test for bacteria (culture).  Tissue sample from an ulcer to examine under a microscope (biopsy).  How is this treated? Treatment for stomatitis depends on the cause. Treatment may include medicines, such as:  Over-the counter (OTC) pain medicines.  Topical anesthetic to numb the area if you have severe pain.  Antibiotics to treat a bacterial infection.  Antifungals to treat a fungal infection.  Antivirals to treat a viral infection.  Mouth rinses that contain steroids to reduce the swelling in your mouth.  Other medicines to coat or numb your mouth.  Follow these instructions at home: Medicines  Take medicines only as directed by your health care provider.  If you were prescribed an antibiotic, finish all of it even if you start to feel better. Lifestyle  Practice good oral hygiene: ? Gently brush your teeth with a soft, nylon-bristled toothbrush two times each day. ? Floss your teeth every day. ? Have your teeth cleaned regularly, as recommended by your dentist.  Eat a balanced diet.Do not eat: ? Spicy foods. ? Citrus, such as oranges. ? Foods that have sharp edges, such as chips.  Avoid any foods or other allergens that you think may be causing your stomatitis.  If you have dentures, make sure that they are properly fitted.  Do not use any tobacco products, including cigarettes,   chewing tobacco, or electronic cigarettes. If you need help quitting, ask your health care provider.  Find ways to reduce stress. Try yoga or meditation. Ask your health care provider for other ideas. General instructions  Use a salt-water rinse for pain as directed by your health care provider. Mix 1 tsp of salt in 2 cups of water.  Drink enough fluid to keep  your urine clear or pale yellow. This will keep you hydrated. Contact a health care provider if:  Your symptoms get worse.  You develop new symptoms, especially: ? A rash. ? New symptoms that do not involve your mouth area.  Your symptoms last longer than three weeks.  Your stomatitis goes away and then returns.  You have a harder time eating and drinking normally.  You have increasing fatigue or weakness.  You lose your appetite or you feel nauseous.  You have a fever. This information is not intended to replace advice given to you by your health care provider. Make sure you discuss any questions you have with your health care provider. Document Released: 04/30/2007 Document Revised: 03/01/2016 Document Reviewed: 06/29/2014 Elsevier Interactive Patient Education  2018 [employer]  Canker Sores Canker sores are small, painful sores that develop inside your mouth. They may also be called aphthous ulcers. You can get canker sores on the inside of your lips or cheeks, on your tongue, or anywhere inside your mouth. You can have just one canker sore or several of them. Canker sores cannot be passed from one person to another (noncontagious). These sores are different than the sores that you may get on the outside of your lips (cold sores or fever blisters). Canker sores usually start as painful red bumps. Then they turn into small white, yellow, or gray ulcers that have red borders. The ulcers may be quite painful. The pain may be worse when you eat or drink. What are the causes? The cause of this condition is not known. What increases the risk? This condition is more likely to develop in:  Women.  People in their teens or 2720s.  Women who are having their menstrual period.  People who are under a lot of emotional stress.  People who do not get enough iron or B vitamins.  People who have poor oral hygiene.  People who have an injury inside the mouth. This can happen after  having dental work or from chewing something hard.  What are the signs or symptoms? Along with the canker sore, symptoms may also include:  Fever.  Fatigue.  Swollen lymph nodes in your neck.  How is this diagnosed? This condition can be diagnosed based on your symptoms. Your health care provider will also examine your mouth. Your health care provider may also do tests if you get canker sores often or if they are very bad. Tests may include:  Blood tests to rule out other causes of canker sores.  Taking swabs from the sore to check for infection.  Taking a small piece of skin from the sore (biopsy) to test it for cancer.  How is this treated? Most canker sores clear up without treatment in about 10 days. Home care is usually the only treatment that you will need. Over-the-counter medicines can relieve discomfort.If you have severe canker sores, your health care provider may prescribe:  Numbing ointment to relieve pain.  Vitamins.  Steroid medicines. These may be given as: ? Oral pills. ? Mouth rinses. ? Gels.  Antibiotic mouth rinse.  Follow these instructions  at home:  Apply, take, or use medicines only as directed by your health care provider. These include vitamins.  If you were prescribed an antibiotic mouth rinse, finish all of it even if you start to feel better.  Until the sores are healed: ? Do not drink coffee or citrus juices. ? Do not eat spicy or salty foods.  Use a mild, over-the-counter mouth rinse as directed by your health care provider.  Practice good oral hygiene. ? Floss your teeth every day. ? Brush your teeth with a soft brush twice each day. Contact a health care provider if:  Your symptoms do not get better after two weeks.  You also have a fever or swollen glands.  You get canker sores often.  You have a canker sore that is getting larger.  You cannot eat or drink due to your canker sores. This information is not intended to replace  advice given to you by your health care provider. Make sure you discuss any questions you have with your health care provider. Document Released: 10/28/2010 Document Revised: 12/09/2015 Document Reviewed: 06/03/2014 Elsevier Interactive Patient Education  2018 [employer]  Oral Ulcers Oral ulcers are sores inside the mouth or near the mouth. They may be called canker sores or cold sores, which are two types of oral ulcers. Many oral ulcers are harmless and go away on their own. In some cases, oral ulcers may require medical care to determine the cause and proper treatment. What are the causes? Common causes of this condition include:  Viral, bacterial, or fungal infection.  Emotional stress.  Foods or chemicals that irritate the mouth.  Injury or physical irritation of the mouth.  Medicines.  Allergies.  Tobacco use.  Less common causes include:  Skin disease.  A type of herpes virus infection (herpes simplexor herpes zoster).  Oral cancer.  In some cases, the cause of this condition may not be known. What increases the risk? Oral ulcers are more likely to develop in:  People who wear dental braces, dentures, or retainers.  People who do not keep their mouth clean or brush their teeth regularly.  People who have sensitive skin.  People who have conditions that affect the entire body (systemic conditions), such as immune disorders.  What are the signs or symptoms? The main symptom of this condition is one or more oval-shaped or round ulcers that have red borders. Details about symptoms may vary depending on the cause.  Location of the ulcers. They may be inside the mouth, on the gums, or on the insides of the lips or cheeks. They may also be on the lips or on skin that is near the mouth, such as the cheeks and chin.  Pain. Ulcers can be painful and uncomfortable, or they can be painless.  Appearance of the ulcers. They may look like red blisters and be filled with  fluid, or they may be white or yellow patches.  Frequency of outbreaks. Ulcers may go away permanently after one outbreak, or they may come back (recur) often or rarely.  How is this diagnosed? This condition is diagnosed with a physical exam. Your health care provider may ask you questions about your lifestyle and your medical history. You may have tests, including:  Blood tests.  Removal of a small number of cells from an ulcer to be examined under a microscope (biopsy).  How is this treated? This condition is treated by managing any pain and discomfort, and by treating the underlying cause of  the ulcers, if necessary. Usually, oral ulcers resolve by themselves in 1-2 weeks. You may be told to keep your mouth clean and avoid things that cause or irritate your ulcers. Your health care provider may prescribe medicines to reduce pain and discomfort or treat the underlying cause, if this applies. Follow these instructions at home: Lifestyle  Follow instructions from your health care provider about eating or drinking restrictions. ? Drink enough fluid to keep your urine clear or pale yellow. ? Avoid foods and drinks that irritate your ulcers.  Avoid tobacco products, including cigarettes, chewing tobacco, or e-cigarettes. If you need help quitting, ask your health care provider.  Avoid excessive alcohol use. Oral Hygiene  Avoid physical or chemical irritants that may have caused the ulcers or made them worse, such as mouthwashes that contain alcohol (ethanol). If you wear dental braces, dentures, or retainers, work with your health care provider to make sure these devices are fitted correctly.  Brush and floss your teeth at least once every day, and get regular dental cleanings and checkups.  Gargle with a salt-water mixture 3-4 times per day or as told by your health care provider. To make a salt-water mixture, completely dissolve -1 tsp of salt in 1 cup of warm water. General  instructions  Take over-the-counter and prescription medicines only as told by your health care provider.  If you have pain, wrap a cold compress in a towel and gently press it against your face to help reduce pain.  Keep all follow-up visits as told by your health care provider. This is important. Contact a health care provider if:  You have pain that gets worse or does not get better with medicine.  You have 4 or more ulcers at one time.  You have a fever.  You have new ulcers that look or feel different from other ulcers you have.  You have inflammation in one eye or both eyes.  You have ulcers that do not go away after 10 days.  You develop new symptoms in your mouth, such as: ? Bleeding or crusting around your lips or gums. ? Tooth pain. ? Difficulty swallowing.  You develop symptoms on your skin or genitals, such as: ? A rash or blisters. ? Burning or itching sensations.  Your ulcers begin or get worse after you start a new medicine. Get help right away if:  You have difficulty breathing.  You have swelling in your face or neck.  You have excessive bleeding from your mouth.  You have severe pain. This information is not intended to replace advice given to you by your health care provider. Make sure you discuss any questions you have with your health care provider. Document Released: 08/10/2004 Document Revised: 12/06/2015 Document Reviewed: 11/18/2014 Elsevier Interactive Patient Education  [employer].

## 2017-08-13 ENCOUNTER — Other Ambulatory Visit: Payer: Self-pay | Admitting: Occupational Medicine

## 2017-08-13 ENCOUNTER — Other Ambulatory Visit: Payer: BLUE CROSS/BLUE SHIELD

## 2017-08-13 DIAGNOSIS — Z021 Encounter for pre-employment examination: Secondary | ICD-10-CM

## 2017-08-17 IMAGING — CR DG CHEST 2V
1 series · 2 of 2 positions shown · non-contrast
Comparison: None.

CLINICAL DATA: Acute onset of dizziness and near syncope. Initial
encounter.

EXAM:
CHEST  2 VIEW

[Series 1: w chest pa · 0.14mm/px · 2 of 2 slices shown]
[im 1/2]
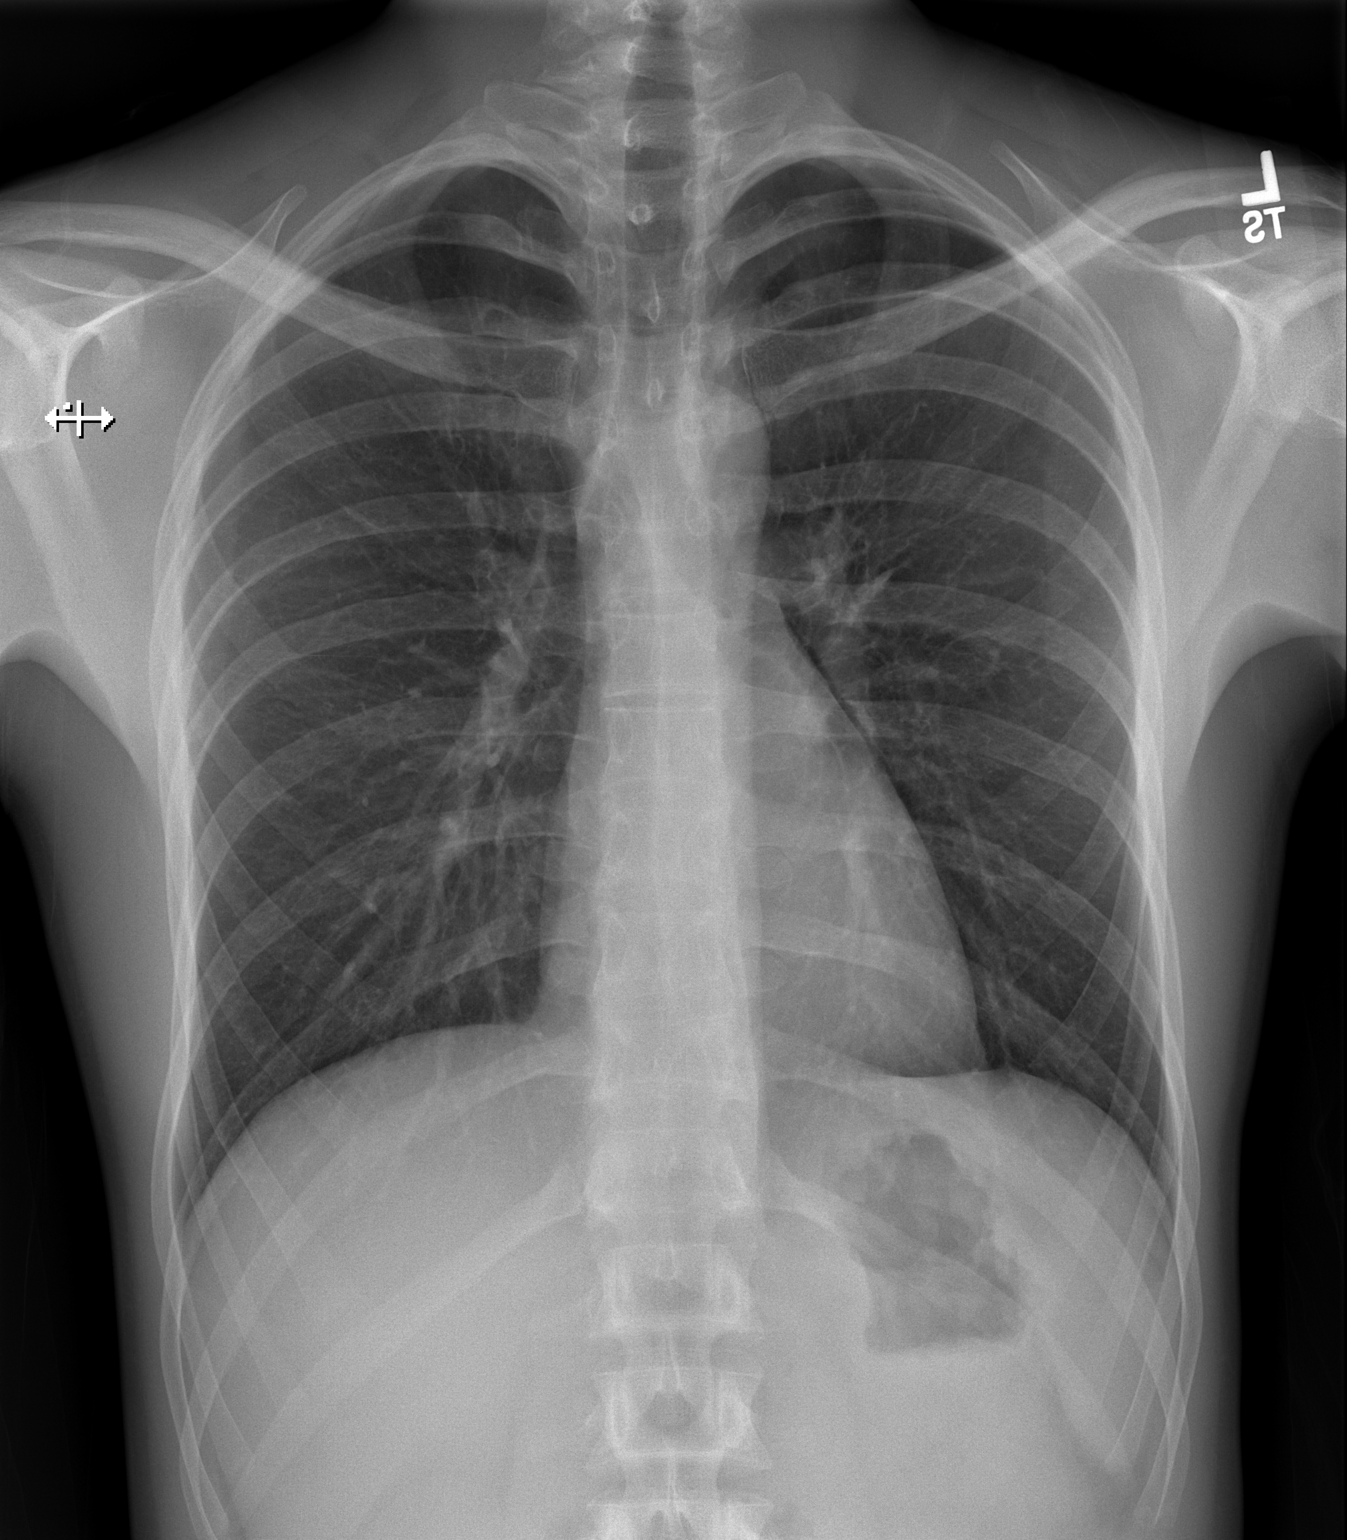
[im 2/2]
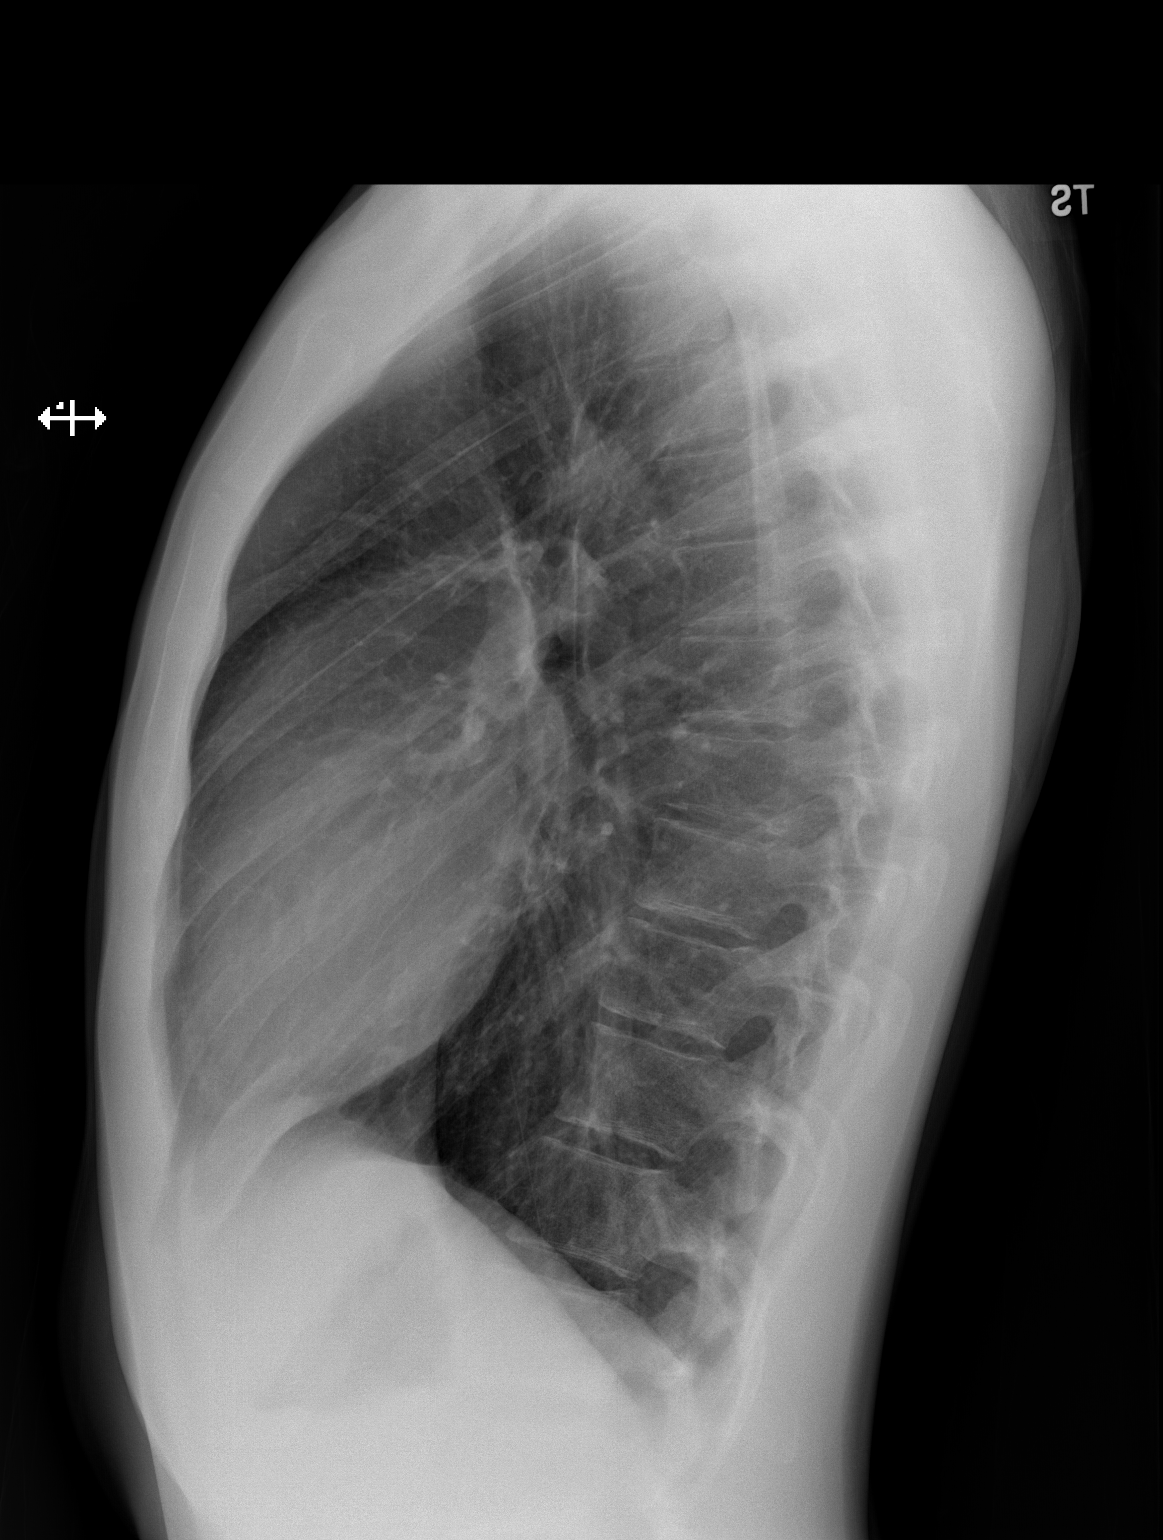

[2 of 2 positions shown; findings below may reference images not displayed]

FINDINGS: The lungs are well-aerated. Mild peribronchial thickening is noted.
There is no evidence of focal opacification, pleural effusion or
pneumothorax.

The heart is normal in size; the mediastinal contour is within
normal limits. No acute osseous abnormalities are seen.
IMPRESSION: Mild peribronchial thickening noted.  Lungs otherwise clear.
